# Patient Record
Sex: Female | Born: 1960
Health system: Southern US, Community
[De-identification: ages and names within clinical notes are randomized; demographics above are authoritative.]

## PROBLEM LIST (undated history)

## (undated) DIAGNOSIS — R748 Abnormal levels of other serum enzymes: Secondary | ICD-10-CM

## (undated) DIAGNOSIS — R7303 Prediabetes: Secondary | ICD-10-CM

## (undated) DIAGNOSIS — J309 Allergic rhinitis, unspecified: Secondary | ICD-10-CM

## (undated) DIAGNOSIS — M7550 Bursitis of unspecified shoulder: Secondary | ICD-10-CM

## (undated) DIAGNOSIS — F3342 Major depressive disorder, recurrent, in full remission: Secondary | ICD-10-CM

## (undated) DIAGNOSIS — K219 Gastro-esophageal reflux disease without esophagitis: Secondary | ICD-10-CM

## (undated) DIAGNOSIS — M542 Cervicalgia: Secondary | ICD-10-CM

## (undated) DIAGNOSIS — D649 Anemia, unspecified: Secondary | ICD-10-CM

## (undated) DIAGNOSIS — M25562 Pain in left knee: Secondary | ICD-10-CM

## (undated) DIAGNOSIS — L710 Perioral dermatitis: Secondary | ICD-10-CM

## (undated) DIAGNOSIS — F32A Depression, unspecified: Secondary | ICD-10-CM

## (undated) DIAGNOSIS — F329 Major depressive disorder, single episode, unspecified: Secondary | ICD-10-CM

## (undated) DIAGNOSIS — E669 Obesity, unspecified: Secondary | ICD-10-CM

## (undated) DIAGNOSIS — K589 Irritable bowel syndrome without diarrhea: Secondary | ICD-10-CM

## (undated) HISTORY — DX: Prediabetes: R73.03

## (undated) HISTORY — DX: Allergic rhinitis, unspecified: J30.9

## (undated) HISTORY — DX: Major depressive disorder, recurrent, in full remission: F33.42

## (undated) HISTORY — DX: Depression, unspecified: F32.A

## (undated) HISTORY — PX: SHOULDER SURGERY: SHX246

## (undated) HISTORY — DX: Cervicalgia: M54.2

## (undated) HISTORY — DX: Anemia, unspecified: D64.9

## (undated) HISTORY — DX: Abnormal levels of other serum enzymes: R74.8

## (undated) HISTORY — DX: Irritable bowel syndrome, unspecified: K58.9

## (undated) HISTORY — DX: Perioral dermatitis: L71.0

## (undated) HISTORY — DX: Major depressive disorder, single episode, unspecified: F32.9

## (undated) HISTORY — DX: Obesity, unspecified: E66.9

## (undated) HISTORY — DX: Bursitis of unspecified shoulder: M75.50

## (undated) HISTORY — DX: Gastro-esophageal reflux disease without esophagitis: K21.9

## (undated) HISTORY — DX: Pain in left knee: M25.562

## (undated) HISTORY — PX: OTHER SURGICAL HISTORY: SHX169

---

## 1997-04-13 ENCOUNTER — Inpatient Hospital Stay (HOSPITAL_COMMUNITY): Admission: AD | Admit: 1997-04-13 | Discharge: 1997-04-13 | Payer: Self-pay | Admitting: Obstetrics and Gynecology

## 1997-04-14 ENCOUNTER — Inpatient Hospital Stay (HOSPITAL_COMMUNITY): Admission: AD | Admit: 1997-04-14 | Discharge: 1997-04-14 | Payer: Self-pay | Admitting: Obstetrics and Gynecology

## 1997-05-19 ENCOUNTER — Inpatient Hospital Stay (HOSPITAL_COMMUNITY): Admission: AD | Admit: 1997-05-19 | Discharge: 1997-05-19 | Payer: Self-pay | Admitting: *Deleted

## 1997-05-26 ENCOUNTER — Observation Stay (HOSPITAL_COMMUNITY): Admission: AD | Admit: 1997-05-26 | Discharge: 1997-05-27 | Payer: Self-pay | Admitting: *Deleted

## 1997-06-07 ENCOUNTER — Inpatient Hospital Stay (HOSPITAL_COMMUNITY): Admission: AD | Admit: 1997-06-07 | Discharge: 1997-06-09 | Payer: Self-pay | Admitting: Obstetrics and Gynecology

## 1998-07-29 ENCOUNTER — Other Ambulatory Visit: Admission: RE | Admit: 1998-07-29 | Discharge: 1998-07-29 | Payer: Self-pay | Admitting: Obstetrics and Gynecology

## 1999-08-15 ENCOUNTER — Other Ambulatory Visit: Admission: RE | Admit: 1999-08-15 | Discharge: 1999-08-15 | Payer: Self-pay | Admitting: Obstetrics and Gynecology

## 2000-07-30 ENCOUNTER — Encounter: Payer: Self-pay | Admitting: Obstetrics and Gynecology

## 2000-07-30 ENCOUNTER — Inpatient Hospital Stay (HOSPITAL_COMMUNITY): Admission: AD | Admit: 2000-07-30 | Discharge: 2000-07-30 | Payer: Self-pay | Admitting: Obstetrics and Gynecology

## 2000-08-19 ENCOUNTER — Inpatient Hospital Stay (HOSPITAL_COMMUNITY): Admission: AD | Admit: 2000-08-19 | Discharge: 2000-08-19 | Payer: Self-pay | Admitting: Obstetrics and Gynecology

## 2000-10-11 ENCOUNTER — Inpatient Hospital Stay (HOSPITAL_COMMUNITY): Admission: AD | Admit: 2000-10-11 | Discharge: 2000-10-11 | Payer: Self-pay | Admitting: Obstetrics and Gynecology

## 2000-10-16 ENCOUNTER — Inpatient Hospital Stay (HOSPITAL_COMMUNITY): Admission: AD | Admit: 2000-10-16 | Discharge: 2000-10-18 | Payer: Self-pay | Admitting: Obstetrics & Gynecology

## 2000-11-15 ENCOUNTER — Other Ambulatory Visit: Admission: RE | Admit: 2000-11-15 | Discharge: 2000-11-15 | Payer: Self-pay | Admitting: Obstetrics and Gynecology

## 2001-12-18 ENCOUNTER — Other Ambulatory Visit: Admission: RE | Admit: 2001-12-18 | Discharge: 2001-12-18 | Payer: Self-pay | Admitting: Obstetrics and Gynecology

## 2002-05-02 ENCOUNTER — Encounter: Admission: RE | Admit: 2002-05-02 | Discharge: 2002-05-02 | Payer: Self-pay | Admitting: Obstetrics and Gynecology

## 2002-05-02 ENCOUNTER — Encounter: Payer: Self-pay | Admitting: Obstetrics and Gynecology

## 2003-01-07 ENCOUNTER — Other Ambulatory Visit: Admission: RE | Admit: 2003-01-07 | Discharge: 2003-01-07 | Payer: Self-pay | Admitting: Obstetrics and Gynecology

## 2004-02-10 ENCOUNTER — Other Ambulatory Visit: Admission: RE | Admit: 2004-02-10 | Discharge: 2004-02-10 | Payer: Self-pay | Admitting: Obstetrics and Gynecology

## 2006-08-05 ENCOUNTER — Emergency Department (HOSPITAL_COMMUNITY): Admission: EM | Admit: 2006-08-05 | Discharge: 2006-08-05 | Payer: Self-pay | Admitting: Emergency Medicine

## 2009-12-18 ENCOUNTER — Encounter: Admission: RE | Admit: 2009-12-18 | Discharge: 2009-12-18 | Payer: Self-pay | Admitting: Family Medicine

## 2010-02-20 ENCOUNTER — Encounter: Payer: Self-pay | Admitting: Family Medicine

## 2010-11-15 LAB — URINALYSIS, ROUTINE W REFLEX MICROSCOPIC
Bilirubin Urine: NEGATIVE
Glucose, UA: NEGATIVE
Hgb urine dipstick: NEGATIVE
Ketones, ur: NEGATIVE
Nitrite: NEGATIVE
Protein, ur: NEGATIVE
Specific Gravity, Urine: 1.027
Urobilinogen, UA: 0.2
pH: 6

## 2010-11-15 LAB — URINE MICROSCOPIC-ADD ON

## 2012-10-15 ENCOUNTER — Ambulatory Visit (INDEPENDENT_AMBULATORY_CARE_PROVIDER_SITE_OTHER): Payer: BC Managed Care – PPO | Admitting: Physician Assistant

## 2012-10-15 VITALS — BP 130/78 | HR 70 | Temp 98.6°F | Resp 18 | Ht 66.5 in | Wt 211.0 lb

## 2012-10-15 DIAGNOSIS — T7840XA Allergy, unspecified, initial encounter: Secondary | ICD-10-CM

## 2012-10-15 DIAGNOSIS — L282 Other prurigo: Secondary | ICD-10-CM

## 2012-10-15 DIAGNOSIS — Z131 Encounter for screening for diabetes mellitus: Secondary | ICD-10-CM

## 2012-10-15 LAB — GLUCOSE, POCT (MANUAL RESULT ENTRY): POC Glucose: 81 mg/dl (ref 70–99)

## 2012-10-15 MED ORDER — METHYLPREDNISOLONE SODIUM SUCC 125 MG IJ SOLR
125.0000 mg | Freq: Once | INTRAMUSCULAR | Status: AC
  Administered 2012-10-15: 125 mg via INTRAMUSCULAR

## 2012-10-15 MED ORDER — METHYLPREDNISOLONE (PAK) 4 MG PO TABS: ORAL_TABLET | ORAL | Status: DC

## 2012-10-15 NOTE — Patient Instructions (Signed)
Continue Benadryl 25-50 mg at bedtime Zyrtec, Claritin, or Allegra (choose 1) daily in the morning Prednisone taper - start tomorrow morning Return if symptoms seem to be worsening or fail to improve.

## 2012-10-15 NOTE — Progress Notes (Signed)
  Subjective:    Patient ID: Donna Herman, female    DOB: [birth year], 52 y.o.   MRN: [id number]  HPI 52 year old female presents with acute onset of rash on face and neck. States symptoms started on 9/14 and has progressively worsened.  Initially only face involved, but it has now spread to her neck. Admits it has become increasingly pruritic and seems to be spreading. [place] resort [place] the [place] and did use the shampoos and soaps that were provided there.  Also ate shrimp the night before the rash started. No hx of allergies to soaps or shellfish. No known allergies or hx of prior reactions like this.  Denies SOB, lip/tongue swelling, or trouble breathing. No vision changes or headache.  Patient is otherwise doing well with no other concerns today.      Review of Systems  Constitutional: Negative for fever and chills.  HENT: Negative for trouble swallowing.   Respiratory: Negative for chest tightness and shortness of breath.   Skin: Positive for rash.       Objective:   Physical Exam  Constitutional: She is oriented to person, place, and time. She appears well-developed and well-nourished.  HENT:  Head: Normocephalic and atraumatic.  Right Ear: External ear normal.  Left Ear: External ear normal.  Eyes: Conjunctivae are normal.  Neck: Normal range of motion.  Cardiovascular: Normal rate, regular rhythm and normal heart sounds.   Pulmonary/Chest: Effort normal and breath sounds normal.  Neurological: She is alert and oriented to person, place, and time.  Skin:     Noted areas have erythematous wheals. No vesicles, induration, drainage, or tenderness.   Psychiatric: She has a normal mood and affect. Her behavior is normal. Judgment and thought content normal.          Assessment & Plan:  Allergic reaction, initial encounter - Plan: methylPREDNISolone sodium succinate (SOLU-MEDROL) 125 mg/2 mL injection 125 mg, methylPREDNIsolone (MEDROL DOSPACK) 4 MG tablet  Pruritic  rash  Screening for diabetes mellitus - Plan: POCT glucose (manual entry)  Solumedrol 125 mg IM today Start medrol dose pack tomorrow Continue Benadryl 25-50 mg at bedtime Zyrtec, Claritin, or Allegra daily in the morning Return if symptoms worsen or fail to improve.

## 2012-12-31 ENCOUNTER — Encounter: Payer: Self-pay | Admitting: Podiatry

## 2012-12-31 ENCOUNTER — Ambulatory Visit (INDEPENDENT_AMBULATORY_CARE_PROVIDER_SITE_OTHER): Payer: BC Managed Care – PPO | Admitting: Podiatry

## 2012-12-31 VITALS — BP 136/75 | HR 71 | Resp 16 | Ht 66.0 in | Wt 211.0 lb

## 2012-12-31 DIAGNOSIS — M778 Other enthesopathies, not elsewhere classified: Secondary | ICD-10-CM

## 2012-12-31 DIAGNOSIS — M7750 Other enthesopathy of unspecified foot: Secondary | ICD-10-CM

## 2012-12-31 NOTE — Progress Notes (Signed)
Donna Herman presents today for followup of her second metatarsophalangeal joint. Last time I saw her was June of 2014 for we once again performed a periarticular or intra-articular steroid injection to the second metatarsophalangeal joint. We have discussed shoe gear as well as orthotics at that time. I have suggested that she followup with [place] in the next few months for possible surgical intervention should her pain not resolve.  Objective: Vital signs are stable she is alert and oriented x3. Pulses remain palpable bilateral lower extremity. She presents today with a hammertoe deformity second left and severe pain overlying the second metatarsophalangeal joint. The pain is very sharp and painful on range of motion of the second metatarsophalangeal joint left. There is overlying erythema and edema is present. Radiographic evaluation demonstrates no fractures but a long second metatarsal.  Assessment: Capsulitis second metatarsophalangeal joint left chronic in nature possible do to plantar flexed elongated second metatarsal and hammertoe deformity.  Plan: MRI to rule out a rupture of the plantar plate and evaluation of the bone prior to surgical consideration.

## 2013-01-06 ENCOUNTER — Telehealth: Payer: Self-pay | Admitting: *Deleted

## 2013-01-06 NOTE — Telephone Encounter (Signed)
Pt states she has not been called for her MRI appt.  I called [employer] on [address], they informed me they do not work from the [employer] and pt's are to be instructed to call [phone].  I made an appt for pt 01/15/2013 at 1215pm arrival for a 1230pm appt.

## 2013-01-08 ENCOUNTER — Other Ambulatory Visit: Payer: Self-pay

## 2013-01-27 ENCOUNTER — Telehealth: Payer: Self-pay | Admitting: *Deleted

## 2013-01-27 NOTE — Telephone Encounter (Signed)
Prior authorization from [facility] [id number], for MRI scheduled 02/03/2013 at 0900am of left foot.  This is valid 12/292014 to 02/25/2013.

## 2013-02-03 ENCOUNTER — Ambulatory Visit
Admission: RE | Admit: 2013-02-03 | Discharge: 2013-02-03 | Disposition: A | Discharge status: Home / Self Care | Payer: BC Managed Care – PPO | Source: Ambulatory Visit | Attending: Podiatry | Admitting: Podiatry

## 2013-02-03 DIAGNOSIS — M779 Enthesopathy, unspecified: Principal | ICD-10-CM

## 2013-02-03 DIAGNOSIS — M778 Other enthesopathies, not elsewhere classified: Secondary | ICD-10-CM

## 2013-02-04 NOTE — Progress Notes (Signed)
Scheduled pt an appt to come in next Tuesday 02-11-13 at 9am.

## 2013-02-11 ENCOUNTER — Ambulatory Visit: Payer: BC Managed Care – PPO | Admitting: Podiatry

## 2013-02-13 ENCOUNTER — Ambulatory Visit (INDEPENDENT_AMBULATORY_CARE_PROVIDER_SITE_OTHER): Payer: BC Managed Care – PPO | Admitting: Podiatry

## 2013-02-13 ENCOUNTER — Encounter: Payer: Self-pay | Admitting: Podiatry

## 2013-02-13 VITALS — BP 118/73 | HR 70 | Resp 16

## 2013-02-13 DIAGNOSIS — M779 Enthesopathy, unspecified: Principal | ICD-10-CM

## 2013-02-13 DIAGNOSIS — M7750 Other enthesopathy of unspecified foot: Secondary | ICD-10-CM

## 2013-02-13 DIAGNOSIS — M778 Other enthesopathies, not elsewhere classified: Secondary | ICD-10-CM

## 2013-02-13 NOTE — Progress Notes (Signed)
Mri results, left foot still about the same, the shoe helps a little bit. She states that her friend have suggested that she see a chiropractor for cold laser therapy.  Objective: Vital signs are stable she is alert and oriented x3. Pulses are strongly palpable right. MRI was positive for a soft tissue mass between the second third digits of the left foot. More than likely capsular rupture or a tear of the plantar plate allowing for medial deviation of the second toe. We also discussed the fact that she has a mild bunion deformity there is also resulting in chronic pressure about the second metatarsophalangeal joint. MRI was not specific for a ruptured joint.  Assessment: Chronic capsulitis and probable tear of the lateral collateral ligament of the second metatarsophalangeal joint left foot mild bunion deformity left foot.  Plan: We discussed the etiology pathology conservative versus surgical therapies. I encouraged her to try the cold laser therapy to see if this would alleviate her symptoms however I did expressed to her that this would do nothing to change the anatomy the resulted in the problem in the first place. I also explained to her that over time this will more than likely worsen resulting in a cocked up medial dislocated second toe possibly overlapping the hallux. She will seek a second opinion possibly be treated for with cold laser therapy and all followup with her in the near future for surgery. We considered her today for a second metatarsal osteotomy with screw. Answered all the questions regarding this to the best of my ability in layman's terms she understood it was amenable to it and signed Dr. pages of the consent form.

## 2013-10-17 ENCOUNTER — Ambulatory Visit
Admission: RE | Admit: 2013-10-17 | Discharge: 2013-10-17 | Disposition: A | Discharge status: Home / Self Care | Payer: BC Managed Care – PPO | Source: Ambulatory Visit | Attending: Gastroenterology | Admitting: Gastroenterology

## 2013-10-17 ENCOUNTER — Other Ambulatory Visit: Payer: Self-pay | Admitting: Gastroenterology

## 2013-10-17 DIAGNOSIS — K589 Irritable bowel syndrome without diarrhea: Secondary | ICD-10-CM

## 2013-10-17 DIAGNOSIS — K625 Hemorrhage of anus and rectum: Secondary | ICD-10-CM

## 2013-10-17 DIAGNOSIS — T189XXA Foreign body of alimentary tract, part unspecified, initial encounter: Secondary | ICD-10-CM

## 2014-01-13 ENCOUNTER — Ambulatory Visit: Payer: BC Managed Care – PPO | Admitting: Podiatry

## 2014-01-20 ENCOUNTER — Ambulatory Visit (INDEPENDENT_AMBULATORY_CARE_PROVIDER_SITE_OTHER): Payer: BC Managed Care – PPO

## 2014-01-20 ENCOUNTER — Encounter: Payer: Self-pay | Admitting: Podiatry

## 2014-01-20 ENCOUNTER — Ambulatory Visit (INDEPENDENT_AMBULATORY_CARE_PROVIDER_SITE_OTHER): Payer: BC Managed Care – PPO | Admitting: Podiatry

## 2014-01-20 DIAGNOSIS — M779 Enthesopathy, unspecified: Secondary | ICD-10-CM

## 2014-01-20 DIAGNOSIS — R52 Pain, unspecified: Secondary | ICD-10-CM

## 2014-01-20 DIAGNOSIS — Q828 Other specified congenital malformations of skin: Secondary | ICD-10-CM

## 2014-01-20 DIAGNOSIS — M7752 Other enthesopathy of left foot: Secondary | ICD-10-CM

## 2014-01-20 DIAGNOSIS — M778 Other enthesopathies, not elsewhere classified: Secondary | ICD-10-CM

## 2014-01-20 NOTE — Progress Notes (Signed)
   Subjective:    Patient ID: Donna MarusWanda S Herman, female    DOB: [birth year], 53 y.o.   MRN: [id number]  HPI NEW PROBLEM:  PT STATED RT OUTSIDE OF THE FOOT IS PAINFUL FOR 3 MONTHS. THE FOOT IS GETTING WORSE AND GET AGGRAVATED WHEN WEARING SHOES. TRIED NO TREATMENT.  Review of Systems  All other systems reviewed and are negative.      Objective:   Physical Exam: Pulses remain palpable to the right foot. Neurologic sensorium is intact. Evaluation demonstrates a tailor's bunion deformity with an area of chronic irritation overlying the fifth metatarsal head secondary to this tailor's bunion deformity. This has resulted in a bursitis and an overlying reactive hyperkeratotic lesion. Radiographic evaluation confirms this.        Assessment & Plan:  Assessment: Bursitis capsulitis porokeratosis tailor's bunion deformity right foot.  Plan: Injected sublesionally today with dexamethasone and local anesthetic to decrease the bursitis and capsulitis. Mechanical debridement of porokeratotic lesion. Discussed appropriate shoe gear stretching exercises ice therapy and shoe modifications. Follow-up with her as needed

## 2014-10-10 ENCOUNTER — Other Ambulatory Visit: Admission: RE | Admit: 2014-10-10 | Payer: Self-pay | Source: Ambulatory Visit | Admitting: *Deleted

## 2015-05-04 DIAGNOSIS — H5213 Myopia, bilateral: Secondary | ICD-10-CM | POA: Diagnosis not present

## 2015-08-25 ENCOUNTER — Other Ambulatory Visit: Payer: Self-pay | Admitting: Family Medicine

## 2015-08-25 ENCOUNTER — Ambulatory Visit
Admission: RE | Admit: 2015-08-25 | Discharge: 2015-08-25 | Disposition: A | Discharge status: Home / Self Care | Payer: BLUE CROSS/BLUE SHIELD | Source: Ambulatory Visit | Attending: Family Medicine | Admitting: Family Medicine

## 2015-08-25 DIAGNOSIS — R1013 Epigastric pain: Secondary | ICD-10-CM | POA: Diagnosis not present

## 2015-08-25 DIAGNOSIS — K37 Unspecified appendicitis: Secondary | ICD-10-CM

## 2015-08-25 DIAGNOSIS — R1031 Right lower quadrant pain: Secondary | ICD-10-CM | POA: Diagnosis not present

## 2015-08-25 MED ORDER — IOPAMIDOL (ISOVUE-300) INJECTION 61%: 100.0000 mL | Freq: Once | INTRAVENOUS | Status: DC | PRN

## 2015-12-30 DIAGNOSIS — Z6837 Body mass index (BMI) 37.0-37.9, adult: Secondary | ICD-10-CM | POA: Diagnosis not present

## 2015-12-30 DIAGNOSIS — Z01419 Encounter for gynecological examination (general) (routine) without abnormal findings: Secondary | ICD-10-CM | POA: Diagnosis not present

## 2016-02-22 DIAGNOSIS — R079 Chest pain, unspecified: Secondary | ICD-10-CM | POA: Diagnosis not present

## 2016-03-06 DIAGNOSIS — Z1231 Encounter for screening mammogram for malignant neoplasm of breast: Secondary | ICD-10-CM | POA: Diagnosis not present

## 2016-03-10 DIAGNOSIS — Z862 Personal history of diseases of the blood and blood-forming organs and certain disorders involving the immune mechanism: Secondary | ICD-10-CM | POA: Diagnosis not present

## 2016-03-10 DIAGNOSIS — Z0000 Encounter for general adult medical examination without abnormal findings: Secondary | ICD-10-CM | POA: Diagnosis not present

## 2016-06-28 DIAGNOSIS — H5213 Myopia, bilateral: Secondary | ICD-10-CM | POA: Diagnosis not present

## 2016-08-30 DIAGNOSIS — Z23 Encounter for immunization: Secondary | ICD-10-CM | POA: Diagnosis not present

## 2016-08-30 DIAGNOSIS — Z111 Encounter for screening for respiratory tuberculosis: Secondary | ICD-10-CM | POA: Diagnosis not present

## 2016-10-31 DIAGNOSIS — Z23 Encounter for immunization: Secondary | ICD-10-CM | POA: Diagnosis not present

## 2016-12-08 DIAGNOSIS — K602 Anal fissure, unspecified: Secondary | ICD-10-CM | POA: Diagnosis not present

## 2016-12-08 DIAGNOSIS — K625 Hemorrhage of anus and rectum: Secondary | ICD-10-CM | POA: Diagnosis not present

## 2017-01-16 DIAGNOSIS — K625 Hemorrhage of anus and rectum: Secondary | ICD-10-CM | POA: Diagnosis not present

## 2017-03-07 DIAGNOSIS — Z1231 Encounter for screening mammogram for malignant neoplasm of breast: Secondary | ICD-10-CM | POA: Diagnosis not present

## 2017-03-12 DIAGNOSIS — Z23 Encounter for immunization: Secondary | ICD-10-CM | POA: Diagnosis not present

## 2017-03-12 DIAGNOSIS — K625 Hemorrhage of anus and rectum: Secondary | ICD-10-CM | POA: Diagnosis not present

## 2017-03-12 DIAGNOSIS — Z13220 Encounter for screening for lipoid disorders: Secondary | ICD-10-CM | POA: Diagnosis not present

## 2017-03-12 DIAGNOSIS — Z0000 Encounter for general adult medical examination without abnormal findings: Secondary | ICD-10-CM | POA: Diagnosis not present

## 2017-03-12 DIAGNOSIS — Z1159 Encounter for screening for other viral diseases: Secondary | ICD-10-CM | POA: Diagnosis not present

## 2017-03-12 DIAGNOSIS — E6609 Other obesity due to excess calories: Secondary | ICD-10-CM | POA: Diagnosis not present

## 2017-03-12 DIAGNOSIS — Z131 Encounter for screening for diabetes mellitus: Secondary | ICD-10-CM | POA: Diagnosis not present

## 2017-04-05 DIAGNOSIS — Z01419 Encounter for gynecological examination (general) (routine) without abnormal findings: Secondary | ICD-10-CM | POA: Diagnosis not present

## 2017-05-03 DIAGNOSIS — Z6834 Body mass index (BMI) 34.0-34.9, adult: Secondary | ICD-10-CM | POA: Diagnosis not present

## 2017-05-03 DIAGNOSIS — Z713 Dietary counseling and surveillance: Secondary | ICD-10-CM | POA: Diagnosis not present

## 2017-05-03 DIAGNOSIS — N951 Menopausal and female climacteric states: Secondary | ICD-10-CM | POA: Diagnosis not present

## 2017-11-05 DIAGNOSIS — M25562 Pain in left knee: Secondary | ICD-10-CM | POA: Diagnosis not present

## 2017-11-05 DIAGNOSIS — M79672 Pain in left foot: Secondary | ICD-10-CM | POA: Diagnosis not present

## 2017-11-05 DIAGNOSIS — M25561 Pain in right knee: Secondary | ICD-10-CM | POA: Diagnosis not present

## 2017-11-15 DIAGNOSIS — M25562 Pain in left knee: Secondary | ICD-10-CM | POA: Diagnosis not present

## 2017-11-15 DIAGNOSIS — M25561 Pain in right knee: Secondary | ICD-10-CM | POA: Diagnosis not present

## 2017-11-15 DIAGNOSIS — R079 Chest pain, unspecified: Secondary | ICD-10-CM | POA: Diagnosis not present

## 2017-11-20 DIAGNOSIS — M25562 Pain in left knee: Secondary | ICD-10-CM | POA: Diagnosis not present

## 2017-11-20 DIAGNOSIS — M25561 Pain in right knee: Secondary | ICD-10-CM | POA: Diagnosis not present

## 2018-03-11 DIAGNOSIS — Z1231 Encounter for screening mammogram for malignant neoplasm of breast: Secondary | ICD-10-CM | POA: Diagnosis not present

## 2018-03-26 DIAGNOSIS — E669 Obesity, unspecified: Secondary | ICD-10-CM | POA: Diagnosis not present

## 2018-03-26 DIAGNOSIS — Z13220 Encounter for screening for lipoid disorders: Secondary | ICD-10-CM | POA: Diagnosis not present

## 2018-03-26 DIAGNOSIS — Z0000 Encounter for general adult medical examination without abnormal findings: Secondary | ICD-10-CM | POA: Diagnosis not present

## 2018-03-26 DIAGNOSIS — K219 Gastro-esophageal reflux disease without esophagitis: Secondary | ICD-10-CM | POA: Diagnosis not present

## 2018-03-26 DIAGNOSIS — R635 Abnormal weight gain: Secondary | ICD-10-CM | POA: Diagnosis not present

## 2018-03-26 DIAGNOSIS — F3342 Major depressive disorder, recurrent, in full remission: Secondary | ICD-10-CM | POA: Diagnosis not present

## 2018-06-19 ENCOUNTER — Other Ambulatory Visit: Payer: Self-pay

## 2018-06-19 ENCOUNTER — Encounter: Payer: Self-pay | Admitting: Emergency Medicine

## 2018-06-19 ENCOUNTER — Ambulatory Visit
Admission: EM | Admit: 2018-06-19 | Discharge: 2018-06-19 | Disposition: A | Discharge status: Home / Self Care | Payer: BLUE CROSS/BLUE SHIELD | Attending: Physician Assistant | Admitting: Physician Assistant

## 2018-06-19 DIAGNOSIS — J029 Acute pharyngitis, unspecified: Secondary | ICD-10-CM

## 2018-06-19 DIAGNOSIS — K120 Recurrent oral aphthae: Secondary | ICD-10-CM

## 2018-06-19 MED ORDER — CHLORHEXIDINE GLUCONATE 0.12 % MT SOLN: 15.0000 mL | Freq: Two times a day (BID) | OROMUCOSAL | 0 refills | Status: DC

## 2018-06-19 MED ORDER — FLUTICASONE PROPIONATE 50 MCG/ACT NA SUSP: 2.0000 | Freq: Every day | NASAL | 0 refills | Status: AC

## 2018-06-19 NOTE — ED Notes (Signed)
Patient able to ambulate independently  

## 2018-06-19 NOTE — Discharge Instructions (Signed)
Start peridex for hygiene. Flonase for nasal congestion/drainage. Continue allergy medicine. Keep hydrated, urine should be clear to pale yellow in color. You can use oragel if needing pain relief. Monitor for any worsening of symptoms, trouble breathing, trouble swallowing, swelling of the throat, leaning forward to breath, drooling, follow up here or at the emergency department for reevaluation.

## 2018-06-19 NOTE — ED Triage Notes (Signed)
Pt presents to [facility] for assessment of cough, congestion, sneezing since being in the yard on Friday.  Pt states this weekend she began having a sore throat, and patient states she noted white spots in the back of her throat.  Pt has been doing warm salt water gargles.  Pt states she noted this afternoon that the spots are spreading in her throat.

## 2018-06-19 NOTE — ED Provider Notes (Signed)
[facility]    CSN: [id number] Arrival date & time: 06/19/18  1518     History   Chief Complaint Chief Complaint  Patient presents with  . Cough    HPI Donna Herman is a 58 y.o. female.   58 year old female comes in for 6-day history of sore throat.  States she has been experiencing 1 to 2 days of cough and sneezing after working in the yard.  She worked in the yard about 6 days ago, and had a few episodes of cough and sneezing, that resolved after a day or 2.  She started noticing sore throat, and occasional "lump in the throat" sensation.  Denies trouble breathing, trouble swallowing, tripoding, drooling, trismus.  Patient states she is to have rhinorrhea, nasal congestion from allergies, but has since resolved after taking loratadine for the past 2 to 3 weeks.  Denies fever, chills, night sweats.  Patient states noticed a spot in the back of her throat, and she has been doing salt water gargles with some relief.  She noticed a second spot in her throat today, and came in for evaluation.  She denies any sick contact.     Past Medical History:  Diagnosis Date  . Depression     There are no active problems to display for this patient.   History reviewed. No pertinent surgical history.  OB History   No obstetric history on file.      Home Medications    Prior to Admission medications   Medication Sig Start Date End Date Taking? Authorizing Provider  buPROPion (WELLBUTRIN XL) 150 MG 24 hr tablet Take 150 mg by mouth daily.    [provider]  chlorhexidine (PERIDEX) 0.12 % solution Use as directed 15 mLs in the mouth or throat 2 (two) times daily. 06/19/18   Cathie HoopsYu, Amy V, PA-C  fish oil-omega-3 fatty acids 1000 MG capsule Take 2 g by mouth daily.    [provider]  fluticasone (FLONASE) 50 MCG/ACT nasal spray Place 2 sprays into both nostrils daily. 06/19/18   Cathie HoopsYu, Amy V, PA-C  Glucosamine-MSM-Hyaluronic Acd (JOINT HEALTH PO) Take by mouth.     [provider]  Multiple Vitamin (MULTIVITAMIN) tablet Take 1 tablet by mouth daily.    [provider]  Norgestimate-Eth Estradiol (SPRINTEC 28 PO) Take by mouth.    [provider]  omeprazole (PRILOSEC) 20 MG capsule Take 20 mg by mouth daily.    [provider]  vitamin E 100 UNIT capsule Take 100 Units by mouth daily.    [provider]    Family History History reviewed. No pertinent family history.  Social History Social History   Tobacco Use  . Smoking status: Never Smoker  . Smokeless tobacco: Never Used  Substance Use Topics  . Alcohol use: No  . Drug use: No     Allergies   Patient has no known allergies.   Review of Systems Review of Systems  Reason unable to perform ROS: See HPI as above.     Physical Exam Triage Vital Signs ED Triage Vitals  Enc Vitals Group     BP 06/19/18 1526 (!) 146/92     Pulse Rate 06/19/18 1526 87     Resp 06/19/18 1526 18     Temp 06/19/18 1526 99.2 F (37.3 C)     Temp Source 06/19/18 1526 Oral     SpO2 06/19/18 1526 97 %     Weight --  Height --      Head Circumference --      Peak Flow --      Pain Score 06/19/18 1529 3     Pain Loc --      Pain Edu? --      Excl. in GC? --    No data found.  Updated Vital Signs BP (!) 146/92 (BP Location: Left Arm)   Pulse 87   Temp 99.2 F (37.3 C) (Oral)   Resp 18   LMP 10/05/2012   SpO2 97%   Physical Exam Constitutional:      General: She is not in acute distress.    Appearance: Normal appearance. She is not ill-appearing, toxic-appearing or diaphoretic.  HENT:     Head: Normocephalic and atraumatic.     Mouth/Throat:     Mouth: Mucous membranes are moist.     Pharynx: Oropharynx is clear. Uvula midline.      Comments: One isolated canker sore noted. No other sores/ulcerations noted. No tonsillar exudates.  Neck:     Musculoskeletal: Normal range of motion and neck supple.  Cardiovascular:     Rate and Rhythm:  Normal rate and regular rhythm.     Heart sounds: Normal heart sounds. No murmur. No friction rub. No gallop.   Pulmonary:     Effort: Pulmonary effort is normal. No accessory muscle usage, prolonged expiration, respiratory distress or retractions.     Comments: Lungs clear to auscultation without adventitious lung sounds. Neurological:     General: No focal deficit present.     Mental Status: She is alert and oriented to person, place, and time.    UC Treatments / Results  Labs (all labs ordered are listed, but only abnormal results are displayed) Labs Reviewed - No data to display  EKG None  Radiology No results found.  Procedures Procedures (including critical care time)  Medications Ordered in UC Medications - No data to display  Initial Impression / Assessment and Plan / UC Course  I have reviewed the triage vital signs and the nursing notes.  Pertinent labs & imaging results that were available during my care of the patient were reviewed by me and considered in my medical decision making (see chart for details).    Discussed using flonase for better allergy control. No signs of strep. Discussed canker sore causing symptoms. Low suspicion of HSV given no history of cold sores, and no groupings of ulcerations. Peridex for hygiene. Return precautions given. Patient expresses understanding and agrees to plan.  Final Clinical Impressions(s) / UC Diagnoses   Final diagnoses:  Sore throat  Canker sore   ED Prescriptions    Medication Sig Dispense Auth. Provider   chlorhexidine (PERIDEX) 0.12 % solution Use as directed 15 mLs in the mouth or throat 2 (two) times daily. 240 mL Yu, Amy V, PA-C   fluticasone (FLONASE) 50 MCG/ACT nasal spray Place 2 sprays into both nostrils daily. 1 g Threasa Alpha, [place] 06/19/18 1559

## 2018-06-28 DIAGNOSIS — H6983 Other specified disorders of Eustachian tube, bilateral: Secondary | ICD-10-CM | POA: Diagnosis not present

## 2018-09-06 DIAGNOSIS — Y9389 Activity, other specified: Secondary | ICD-10-CM | POA: Diagnosis not present

## 2018-09-06 DIAGNOSIS — M25531 Pain in right wrist: Secondary | ICD-10-CM | POA: Diagnosis not present

## 2018-09-06 DIAGNOSIS — S4990XA Unspecified injury of shoulder and upper arm, unspecified arm, initial encounter: Secondary | ICD-10-CM | POA: Diagnosis not present

## 2018-09-06 DIAGNOSIS — S6991XA Unspecified injury of right wrist, hand and finger(s), initial encounter: Secondary | ICD-10-CM | POA: Diagnosis not present

## 2018-09-06 DIAGNOSIS — W1839XA Other fall on same level, initial encounter: Secondary | ICD-10-CM | POA: Diagnosis not present

## 2018-09-06 DIAGNOSIS — S4991XA Unspecified injury of right shoulder and upper arm, initial encounter: Secondary | ICD-10-CM | POA: Diagnosis not present

## 2018-09-06 DIAGNOSIS — M25511 Pain in right shoulder: Secondary | ICD-10-CM | POA: Diagnosis not present

## 2018-09-06 DIAGNOSIS — K219 Gastro-esophageal reflux disease without esophagitis: Secondary | ICD-10-CM | POA: Diagnosis not present

## 2018-09-06 DIAGNOSIS — Z79899 Other long term (current) drug therapy: Secondary | ICD-10-CM | POA: Diagnosis not present

## 2018-09-11 DIAGNOSIS — S63501A Unspecified sprain of right wrist, initial encounter: Secondary | ICD-10-CM | POA: Diagnosis not present

## 2018-09-11 DIAGNOSIS — M25511 Pain in right shoulder: Secondary | ICD-10-CM | POA: Diagnosis not present

## 2018-09-11 DIAGNOSIS — M25531 Pain in right wrist: Secondary | ICD-10-CM | POA: Diagnosis not present

## 2018-09-24 ENCOUNTER — Other Ambulatory Visit: Payer: Self-pay | Admitting: Family Medicine

## 2018-09-24 DIAGNOSIS — G8929 Other chronic pain: Secondary | ICD-10-CM

## 2018-09-25 DIAGNOSIS — Z6837 Body mass index (BMI) 37.0-37.9, adult: Secondary | ICD-10-CM | POA: Diagnosis not present

## 2018-09-25 DIAGNOSIS — Z01419 Encounter for gynecological examination (general) (routine) without abnormal findings: Secondary | ICD-10-CM | POA: Diagnosis not present

## 2018-10-13 ENCOUNTER — Other Ambulatory Visit: Payer: Self-pay

## 2018-10-13 ENCOUNTER — Ambulatory Visit
Admission: RE | Admit: 2018-10-13 | Discharge: 2018-10-13 | Disposition: A | Discharge status: Home / Self Care | Payer: Self-pay | Source: Ambulatory Visit | Attending: Family Medicine | Admitting: Family Medicine

## 2018-10-13 DIAGNOSIS — S43431A Superior glenoid labrum lesion of right shoulder, initial encounter: Secondary | ICD-10-CM | POA: Diagnosis not present

## 2018-10-13 DIAGNOSIS — G8929 Other chronic pain: Secondary | ICD-10-CM

## 2018-10-13 DIAGNOSIS — M25511 Pain in right shoulder: Secondary | ICD-10-CM

## 2018-10-28 DIAGNOSIS — M75121 Complete rotator cuff tear or rupture of right shoulder, not specified as traumatic: Secondary | ICD-10-CM | POA: Diagnosis not present

## 2018-11-26 DIAGNOSIS — G8918 Other acute postprocedural pain: Secondary | ICD-10-CM | POA: Diagnosis not present

## 2018-11-26 DIAGNOSIS — M24111 Other articular cartilage disorders, right shoulder: Secondary | ICD-10-CM | POA: Diagnosis not present

## 2018-11-26 DIAGNOSIS — X58XXXA Exposure to other specified factors, initial encounter: Secondary | ICD-10-CM | POA: Diagnosis not present

## 2018-11-26 DIAGNOSIS — Y999 Unspecified external cause status: Secondary | ICD-10-CM | POA: Diagnosis not present

## 2018-11-26 DIAGNOSIS — M7541 Impingement syndrome of right shoulder: Secondary | ICD-10-CM | POA: Diagnosis not present

## 2018-11-26 DIAGNOSIS — S46011A Strain of muscle(s) and tendon(s) of the rotator cuff of right shoulder, initial encounter: Secondary | ICD-10-CM | POA: Diagnosis not present

## 2018-12-06 DIAGNOSIS — S46011D Strain of muscle(s) and tendon(s) of the rotator cuff of right shoulder, subsequent encounter: Secondary | ICD-10-CM | POA: Diagnosis not present

## 2018-12-06 DIAGNOSIS — M25611 Stiffness of right shoulder, not elsewhere classified: Secondary | ICD-10-CM | POA: Diagnosis not present

## 2018-12-11 DIAGNOSIS — S46011D Strain of muscle(s) and tendon(s) of the rotator cuff of right shoulder, subsequent encounter: Secondary | ICD-10-CM | POA: Diagnosis not present

## 2018-12-11 DIAGNOSIS — M25611 Stiffness of right shoulder, not elsewhere classified: Secondary | ICD-10-CM | POA: Diagnosis not present

## 2018-12-13 DIAGNOSIS — M25611 Stiffness of right shoulder, not elsewhere classified: Secondary | ICD-10-CM | POA: Diagnosis not present

## 2018-12-13 DIAGNOSIS — S46011D Strain of muscle(s) and tendon(s) of the rotator cuff of right shoulder, subsequent encounter: Secondary | ICD-10-CM | POA: Diagnosis not present

## 2018-12-17 DIAGNOSIS — S46011D Strain of muscle(s) and tendon(s) of the rotator cuff of right shoulder, subsequent encounter: Secondary | ICD-10-CM | POA: Diagnosis not present

## 2018-12-17 DIAGNOSIS — M25611 Stiffness of right shoulder, not elsewhere classified: Secondary | ICD-10-CM | POA: Diagnosis not present

## 2019-01-02 DIAGNOSIS — S46011D Strain of muscle(s) and tendon(s) of the rotator cuff of right shoulder, subsequent encounter: Secondary | ICD-10-CM | POA: Diagnosis not present

## 2019-01-02 DIAGNOSIS — M25611 Stiffness of right shoulder, not elsewhere classified: Secondary | ICD-10-CM | POA: Diagnosis not present

## 2019-01-08 DIAGNOSIS — S46011D Strain of muscle(s) and tendon(s) of the rotator cuff of right shoulder, subsequent encounter: Secondary | ICD-10-CM | POA: Diagnosis not present

## 2019-01-08 DIAGNOSIS — M25611 Stiffness of right shoulder, not elsewhere classified: Secondary | ICD-10-CM | POA: Diagnosis not present

## 2019-01-15 DIAGNOSIS — M25611 Stiffness of right shoulder, not elsewhere classified: Secondary | ICD-10-CM | POA: Diagnosis not present

## 2019-01-15 DIAGNOSIS — S46011D Strain of muscle(s) and tendon(s) of the rotator cuff of right shoulder, subsequent encounter: Secondary | ICD-10-CM | POA: Diagnosis not present

## 2019-01-17 DIAGNOSIS — S46011D Strain of muscle(s) and tendon(s) of the rotator cuff of right shoulder, subsequent encounter: Secondary | ICD-10-CM | POA: Diagnosis not present

## 2019-01-17 DIAGNOSIS — M25611 Stiffness of right shoulder, not elsewhere classified: Secondary | ICD-10-CM | POA: Diagnosis not present

## 2019-01-20 DIAGNOSIS — S46011D Strain of muscle(s) and tendon(s) of the rotator cuff of right shoulder, subsequent encounter: Secondary | ICD-10-CM | POA: Diagnosis not present

## 2019-01-20 DIAGNOSIS — M25611 Stiffness of right shoulder, not elsewhere classified: Secondary | ICD-10-CM | POA: Diagnosis not present

## 2019-01-22 DIAGNOSIS — M25611 Stiffness of right shoulder, not elsewhere classified: Secondary | ICD-10-CM | POA: Diagnosis not present

## 2019-01-22 DIAGNOSIS — S46011D Strain of muscle(s) and tendon(s) of the rotator cuff of right shoulder, subsequent encounter: Secondary | ICD-10-CM | POA: Diagnosis not present

## 2019-01-27 DIAGNOSIS — M25611 Stiffness of right shoulder, not elsewhere classified: Secondary | ICD-10-CM | POA: Diagnosis not present

## 2019-01-27 DIAGNOSIS — S46011D Strain of muscle(s) and tendon(s) of the rotator cuff of right shoulder, subsequent encounter: Secondary | ICD-10-CM | POA: Diagnosis not present

## 2019-02-19 DIAGNOSIS — Z9889 Other specified postprocedural states: Secondary | ICD-10-CM | POA: Diagnosis not present

## 2019-05-20 DIAGNOSIS — N950 Postmenopausal bleeding: Secondary | ICD-10-CM | POA: Diagnosis not present

## 2019-05-20 DIAGNOSIS — N840 Polyp of corpus uteri: Secondary | ICD-10-CM | POA: Diagnosis not present

## 2019-05-26 DIAGNOSIS — Z09 Encounter for follow-up examination after completed treatment for conditions other than malignant neoplasm: Secondary | ICD-10-CM | POA: Diagnosis not present

## 2019-06-26 DIAGNOSIS — N840 Polyp of corpus uteri: Secondary | ICD-10-CM | POA: Diagnosis not present

## 2019-06-26 DIAGNOSIS — N950 Postmenopausal bleeding: Secondary | ICD-10-CM | POA: Diagnosis not present

## 2019-07-03 DIAGNOSIS — Z1231 Encounter for screening mammogram for malignant neoplasm of breast: Secondary | ICD-10-CM | POA: Diagnosis not present

## 2019-07-11 DIAGNOSIS — K219 Gastro-esophageal reflux disease without esophagitis: Secondary | ICD-10-CM | POA: Diagnosis not present

## 2019-07-11 DIAGNOSIS — E669 Obesity, unspecified: Secondary | ICD-10-CM | POA: Diagnosis not present

## 2019-07-11 DIAGNOSIS — R7303 Prediabetes: Secondary | ICD-10-CM | POA: Diagnosis not present

## 2019-07-11 DIAGNOSIS — Z13220 Encounter for screening for lipoid disorders: Secondary | ICD-10-CM | POA: Diagnosis not present

## 2019-07-11 DIAGNOSIS — Z79899 Other long term (current) drug therapy: Secondary | ICD-10-CM | POA: Diagnosis not present

## 2019-07-11 DIAGNOSIS — F3342 Major depressive disorder, recurrent, in full remission: Secondary | ICD-10-CM | POA: Diagnosis not present

## 2019-07-18 ENCOUNTER — Other Ambulatory Visit: Payer: Self-pay | Admitting: Family Medicine

## 2019-07-18 DIAGNOSIS — R7989 Other specified abnormal findings of blood chemistry: Secondary | ICD-10-CM

## 2019-08-01 ENCOUNTER — Ambulatory Visit
Admission: RE | Admit: 2019-08-01 | Discharge: 2019-08-01 | Disposition: A | Discharge status: Home / Self Care | Payer: BC Managed Care – PPO | Source: Ambulatory Visit | Attending: Family Medicine | Admitting: Family Medicine

## 2019-08-01 DIAGNOSIS — R7989 Other specified abnormal findings of blood chemistry: Secondary | ICD-10-CM

## 2020-02-02 DIAGNOSIS — K219 Gastro-esophageal reflux disease without esophagitis: Secondary | ICD-10-CM | POA: Diagnosis not present

## 2020-02-02 DIAGNOSIS — Z23 Encounter for immunization: Secondary | ICD-10-CM | POA: Diagnosis not present

## 2020-02-02 DIAGNOSIS — R748 Abnormal levels of other serum enzymes: Secondary | ICD-10-CM | POA: Diagnosis not present

## 2020-02-02 DIAGNOSIS — Z13220 Encounter for screening for lipoid disorders: Secondary | ICD-10-CM | POA: Diagnosis not present

## 2020-02-02 DIAGNOSIS — R7303 Prediabetes: Secondary | ICD-10-CM | POA: Diagnosis not present

## 2020-02-02 DIAGNOSIS — E669 Obesity, unspecified: Secondary | ICD-10-CM | POA: Diagnosis not present

## 2020-02-02 DIAGNOSIS — Z0000 Encounter for general adult medical examination without abnormal findings: Secondary | ICD-10-CM | POA: Diagnosis not present

## 2020-02-02 DIAGNOSIS — F3342 Major depressive disorder, recurrent, in full remission: Secondary | ICD-10-CM | POA: Diagnosis not present

## 2020-02-12 DIAGNOSIS — U071 COVID-19: Secondary | ICD-10-CM | POA: Diagnosis not present

## 2020-02-12 DIAGNOSIS — Z20828 Contact with and (suspected) exposure to other viral communicable diseases: Secondary | ICD-10-CM | POA: Diagnosis not present

## 2020-02-12 DIAGNOSIS — Z1152 Encounter for screening for COVID-19: Secondary | ICD-10-CM | POA: Diagnosis not present

## 2020-03-26 DIAGNOSIS — Z01419 Encounter for gynecological examination (general) (routine) without abnormal findings: Secondary | ICD-10-CM | POA: Diagnosis not present

## 2020-03-26 DIAGNOSIS — Z6832 Body mass index (BMI) 32.0-32.9, adult: Secondary | ICD-10-CM | POA: Diagnosis not present

## 2020-05-17 DIAGNOSIS — R0789 Other chest pain: Secondary | ICD-10-CM | POA: Diagnosis not present

## 2020-06-14 DIAGNOSIS — R079 Chest pain, unspecified: Secondary | ICD-10-CM | POA: Diagnosis not present

## 2020-07-05 DIAGNOSIS — Z1231 Encounter for screening mammogram for malignant neoplasm of breast: Secondary | ICD-10-CM | POA: Diagnosis not present

## 2020-08-20 ENCOUNTER — Ambulatory Visit (INDEPENDENT_AMBULATORY_CARE_PROVIDER_SITE_OTHER): Payer: BC Managed Care – PPO | Admitting: Internal Medicine

## 2020-08-20 ENCOUNTER — Encounter: Payer: Self-pay | Admitting: Internal Medicine

## 2020-08-20 ENCOUNTER — Other Ambulatory Visit: Payer: Self-pay

## 2020-08-20 VITALS — BP 124/76 | HR 68 | Ht 66.25 in | Wt 208.4 lb

## 2020-08-20 DIAGNOSIS — R079 Chest pain, unspecified: Secondary | ICD-10-CM

## 2020-08-20 DIAGNOSIS — Z1321 Encounter for screening for nutritional disorder: Secondary | ICD-10-CM

## 2020-08-20 NOTE — Patient Instructions (Signed)
Medication Instructions:  No changes today *If you need a refill on your cardiac medications before your next appointment, please call your pharmacy*   Lab Work: Today: NMR lipoprofile, vit D  If you have labs (blood work) drawn today and your tests are completely normal, you will receive your results only by: MyChart Message (if you have MyChart) OR A paper copy in the mail If you have any lab test that is abnormal or we need to change your treatment, we will call you to review the results.   Testing/Procedures: Calcium score ct scan   Follow-Up: As needed, pending testing results   Other Instructions

## 2020-08-20 NOTE — Progress Notes (Signed)
Cardiology Office Note   Date:  08/20/2020   ID:  Donna Herman, DOB 1960/06/09, MRN [id number]  PCP:  Sigmund Hazel, MD  Cardiologist:   Dietrich Pates, MD   Pt referred for CP       History of Present Illness: Donna Herman is a 60 y.o. female with a history of chest discomfort    She was seen in [place] in early may for CP    Then seen by L Hyacinth Meeker [facility])  The pt says her initial pain was earlier this spring   Had been in [place]   When got back develop   Left sided discomfort.   Tension sensation.   Not associated with activity   Would come and go with activity    No precipitating factor   No SOB  No diaphoresis.   WOuld last seconds to min    Went to [place] (Dr Hyacinth Meeker) CXR ordered by Dr Hyacinth Meeker   Normal  Since seen rarely have a spell      Now at beach more   Walks/exercises occsiaonally      DIET   Br:  [place] toast  banana nut muffin   Almond milk Lunch   Varies    Leftovoers   [employer] and one to 2 veggies Drink:   Water   Unsweet almond milk  Tea Hot in AM   Day Splenda   Occasion sugar       Current Meds  Medication Sig   buPROPion (WELLBUTRIN XL) 150 MG 24 hr tablet Take 150 mg by mouth daily.   Calcium Carbonate (CALCIUM 600 PO) Take by mouth.   cholecalciferol (VITAMIN D3) 25 MCG (1000 UNIT) tablet Take 1,000 Units by mouth daily.   estradiol (ESTRACE) 2 MG tablet Take 2 mg by mouth daily.   ferrous sulfate 325 (65 FE) MG EC tablet Take 325 mg by mouth 3 (three) times daily with meals.   fish oil-omega-3 fatty acids 1000 MG capsule Take 2 g by mouth daily.   Melatonin 10 MG CAPS Take 10 mg by mouth as needed (sleep).   Multiple Vitamin (MULTIVITAMIN) tablet Take 1 tablet by mouth daily.   omeprazole (PRILOSEC) 20 MG capsule Take 20 mg by mouth daily as needed (reflux).   PROGESTERONE PO Take 100 mg by mouth.   vitamin B-12 (CYANOCOBALAMIN) 1000 MCG tablet Take 1,000 mcg by mouth daily.     Allergies:   Patient has no known allergies.   Past  Medical History:  Diagnosis Date   Allergic rhinitis    Anemia    Depression    Elevated liver enzymes    Gastro-esophageal reflux disease without esophagitis    IBS (irritable bowel syndrome)    Medial knee pain, left    Neck pain    Obesity    Perioral dermatitis    Prediabetes    Recurrent major depressive disorder, in full remission (HCC)    Subacromial bursitis     Past Surgical History:  Procedure Laterality Date   SHOULDER SURGERY     WIDSOM TEETH       Social History:  The patient  reports that she has never smoked. She has never used smokeless tobacco. She reports current alcohol use. She reports that she does not use drugs.   Family History:  The patient's family history includes Alcoholism in her brother; Arthritis in her mother; Bladder Cancer in her brother; Diabetes in her brother and father; Fibromyalgia  in her sister; Gout in her brother; Heart attack in her father; Other in her mother and sister.    ROS:  Please see the history of present illness. All other systems are reviewed and  Negative to the above problem except as noted.    PHYSICAL EXAM: VS:  BP 124/76   Pulse 68   Ht 5' 6.25" (1.683 m)   Wt 208 lb 6.4 oz (94.5 kg)   LMP 10/05/2012   SpO2 97%   BMI 33.38 kg/m   FVC:BSWHQ 60 yo in no acute distress  HEENT: normal  Neck: no JVD, carotid bruits Cardiac: RRR; no murmurs  No LE edema  Respiratory:  clear to auscultation bilaterally,  GI: soft, nontender, nondistended, + BS  No hepatomegaly  MS: no deformity Moving all extremities   Skin: warm and dry, no rash Neuro:  Strength and sensation are intact Psych: euthymic mood, full affect   EKG:  EKG is not ordered today.  On 05/17/20  SB 51 bpm     Lipid Panel No results found for: CHOL, TRIG, HDL, CHOLHDL, VLDL, LDLCALC, LDLDIRECT    Wt Readings from Last 3 Encounters:  08/20/20 208 lb 6.4 oz (94.5 kg)  12/31/12 211 lb (95.7 kg)  10/15/12 211 lb (95.7 kg)      ASSESSMENT AND  PLAN:  1  CP   Pt had CP that began earlier thsi sping   Atypical   Has improved wihout intervention    IT does not sound cardiac in origin   Follow   2   Cardiac risk stratification   Pt with evid of hepatic steatosis and therefor at incrased risk for CAD    Will set up for labs (lipomed, lp(a), APoB), as well as Ca score CT      3   Nutrition  Recent USN of abdomen showed hepatic steatosis    Discussed diet    Watch sugar    Will refer to dietary    F/U based on test results     Current medicines are reviewed at length with the patient today.  The patient does not have concerns regarding medicines.  Signed, Dietrich Pates, MD  08/20/2020 10:22 AM    [facility] [address] [place], [place], [place]  [address] Phone: [phone]; Fax: [phone]

## 2020-08-21 LAB — NMR, LIPOPROFILE
Cholesterol, Total: 192 mg/dL (ref 100–199)
HDL Particle Number: 47.2 umol/L (ref >=30.5)
HDL-C: 62 mg/dL (ref >39)
LDL Particle Number: 1133 nmol/L — ABNORMAL HIGH (ref <1000)
LDL Size: 21.4 nm (ref >20.5)
LDL-C (NIH Calc): 89 mg/dL (ref 0–99)
LP-IR Score: 57 — ABNORMAL HIGH (ref <=45)
Small LDL Particle Number: 422 nmol/L (ref <=527)
Triglycerides: 248 mg/dL — ABNORMAL HIGH (ref 0–149)

## 2020-08-21 LAB — LIPOPROTEIN A (LPA): Lipoprotein (a): 27.7 nmol/L (ref <75.0)

## 2020-08-21 LAB — APOLIPOPROTEIN B: Apolipoprotein B: 77 mg/dL (ref <90)

## 2020-08-21 LAB — VITAMIN D 25 HYDROXY (VIT D DEFICIENCY, FRACTURES): Vit D, 25-Hydroxy: 38.2 ng/mL (ref 30.0–100.0)

## 2020-10-08 ENCOUNTER — Encounter: Payer: BC Managed Care – PPO | Attending: Internal Medicine | Admitting: Dietician

## 2020-10-08 ENCOUNTER — Other Ambulatory Visit: Payer: Self-pay

## 2020-10-08 ENCOUNTER — Encounter: Payer: Self-pay | Admitting: Dietician

## 2020-10-08 ENCOUNTER — Ambulatory Visit (INDEPENDENT_AMBULATORY_CARE_PROVIDER_SITE_OTHER)
Admission: RE | Admit: 2020-10-08 | Discharge: 2020-10-08 | Disposition: A | Discharge status: Home / Self Care | Payer: Self-pay | Source: Ambulatory Visit | Attending: Internal Medicine | Admitting: Internal Medicine

## 2020-10-08 DIAGNOSIS — I871 Compression of vein: Secondary | ICD-10-CM | POA: Diagnosis not present

## 2020-10-08 DIAGNOSIS — R079 Chest pain, unspecified: Secondary | ICD-10-CM

## 2020-10-08 NOTE — Patient Instructions (Addendum)
Continue a low fat high vegetable diet. Whole grains (quinoa, barley, brown rice, etc) Legumes Fresh fruit Aim for more than >25 grams fiber per day  Continue to stay active Continue mindful eating and weight loss  Great job on the changes that you have made!

## 2020-10-08 NOTE — Progress Notes (Signed)
Medical Nutrition Therapy  Appointment Start time:  1310  Appointment End time:  1420  Primary concerns today: Patient is being worked up for chest pain.  She had a chest CT this am and results are pending.  She also was just found to have fatty liver and increased triglycerides.  She would like to learn better options to improve this.  She has been eating more fruits and vegetables.  She has reduced her carbohydrate intake.  Avoids sugar.  She has lost 17 lbs in the past 8 weeks.  She states that she cannot lose weight unless she exercises.    She states that the chest pain has reduced since before seeing MD 08/20/2020.   Referral diagnosis: chest pain, nutrition disorder Preferred learning style: no preference indicated Learning readiness: ready, change in progress   NUTRITION ASSESSMENT   Anthropometrics  66.5" 191 lbs 10/08/2020 208 lbs 08/20/2020   Clinical Medical Hx: hepatic stenosis, increased triglycerides Medications: see list Labs: 08/20/2020:  HDL 62, LDL 89, LDL particle # 1133, Triglycerides 248, Vitamin D 38 Notable Signs/Symptoms: none  Lifestyle & Dietary Hx Patient lives with her husband.  They [place] most of the time [place].  She has a garden and is outside daily.  She reports a poor memory.  Supplements: vitamin D, MVI, Omega 3, vitamin B-12, vitamin C.  She was told to watch her calcium intake until the results of her CT scan are known. Sleep: very well.  8 hours per night Stress / self-care: normal Current average weekly physical activity: 5 days per week since August 5.  Walks on the beach or elliptical, recumbent bike and rowing machine in her home at the beach.  Minimum 35 minutes and most 45-55 minutes daily.  24-Hr Dietary Recall Avoids bananas ("high in sugar") Only uses olive oil, no fried foods Stopped alcohol (was drinking 3 glasses of wine twice per month). Limits red meat.  Eats it only about 10% of the time. First Meal: protein shake (low  sugar protein powder - Invigor8), fruit, vegetables, apple cider vinegar, water) OR eggs 1-2 days per week, salsa, 1/2 avocado OR yogurt with nuts, dried figs Snack: none Second Meal:  Salad with oil and vinegar OR leftovers OR [employer] ([place] chicken meal with quinoa, rice) Snack:  Third Meal: meat and vegetables OR Okra, tomatoes, shrimp OR fish (fresh caught) grilled and vegetables Snack: rare goat cheese with almond cracker and fig jam Beverages: water, protein shake, unsweetened tea with splenda  NUTRITION DIAGNOSIS  NB-1.1 Food and nutrition-related knowledge deficit As related to nutrition for liver.  As evidenced by diet hx and patient report.   NUTRITION INTERVENTION  Nutrition education (E-1) on the following topics:  Encouragement of changes made Benefits of increased fiber and minimally processed foods Benefits of continued activity Benefits of continued weight loss Legumes as plant based protein source Continue avoiding sugar  Handouts Provided Include  High triglyceride nutrition therapy High triglyceride label reading tips High triglyceride meal planning tips Mediterranean diet  Learning Style & Readiness for Change Teaching method utilized: Visual & Auditory  Demonstrated degree of understanding via: Teach Back  Barriers to learning/adherence to lifestyle change: none  Goals Established by Pt Continue a low fat high vegetable diet. Whole grains (quinoa, barley, brown rice, etc) Legumes Fresh fruit Aim for more than >25 grams fiber per day  Continue to stay active Continue mindful eating and weight loss  Great job on the changes that you have made!  MONITORING & EVALUATION Dietary intake, weekly physical activity, and label reading prn.  Next Steps  Patient is to call for questions.

## 2021-02-01 DIAGNOSIS — E119 Type 2 diabetes mellitus without complications: Secondary | ICD-10-CM | POA: Diagnosis not present

## 2021-02-02 DIAGNOSIS — Z13220 Encounter for screening for lipoid disorders: Secondary | ICD-10-CM | POA: Diagnosis not present

## 2021-02-02 DIAGNOSIS — F3342 Major depressive disorder, recurrent, in full remission: Secondary | ICD-10-CM | POA: Diagnosis not present

## 2021-02-02 DIAGNOSIS — E663 Overweight: Secondary | ICD-10-CM | POA: Diagnosis not present

## 2021-02-02 DIAGNOSIS — Z0000 Encounter for general adult medical examination without abnormal findings: Secondary | ICD-10-CM | POA: Diagnosis not present

## 2021-02-02 DIAGNOSIS — K760 Fatty (change of) liver, not elsewhere classified: Secondary | ICD-10-CM | POA: Diagnosis not present

## 2021-02-02 DIAGNOSIS — R7303 Prediabetes: Secondary | ICD-10-CM | POA: Diagnosis not present

## 2021-02-02 DIAGNOSIS — K625 Hemorrhage of anus and rectum: Secondary | ICD-10-CM | POA: Diagnosis not present

## 2021-02-02 DIAGNOSIS — K219 Gastro-esophageal reflux disease without esophagitis: Secondary | ICD-10-CM | POA: Diagnosis not present

## 2021-03-28 DIAGNOSIS — Z01419 Encounter for gynecological examination (general) (routine) without abnormal findings: Secondary | ICD-10-CM | POA: Diagnosis not present

## 2021-03-28 DIAGNOSIS — Z6828 Body mass index (BMI) 28.0-28.9, adult: Secondary | ICD-10-CM | POA: Diagnosis not present

## 2021-03-28 DIAGNOSIS — R87610 Atypical squamous cells of undetermined significance on cytologic smear of cervix (ASC-US): Secondary | ICD-10-CM | POA: Diagnosis not present

## 2021-05-17 DIAGNOSIS — K760 Fatty (change of) liver, not elsewhere classified: Secondary | ICD-10-CM | POA: Diagnosis not present

## 2021-05-17 DIAGNOSIS — K219 Gastro-esophageal reflux disease without esophagitis: Secondary | ICD-10-CM | POA: Diagnosis not present

## 2021-05-17 DIAGNOSIS — K625 Hemorrhage of anus and rectum: Secondary | ICD-10-CM | POA: Diagnosis not present

## 2021-05-17 DIAGNOSIS — K6289 Other specified diseases of anus and rectum: Secondary | ICD-10-CM | POA: Diagnosis not present

## 2021-06-23 DIAGNOSIS — K319 Disease of stomach and duodenum, unspecified: Secondary | ICD-10-CM | POA: Diagnosis not present

## 2021-06-23 DIAGNOSIS — K2970 Gastritis, unspecified, without bleeding: Secondary | ICD-10-CM | POA: Diagnosis not present

## 2021-06-23 DIAGNOSIS — K625 Hemorrhage of anus and rectum: Secondary | ICD-10-CM | POA: Diagnosis not present

## 2021-06-23 DIAGNOSIS — K648 Other hemorrhoids: Secondary | ICD-10-CM | POA: Diagnosis not present

## 2021-06-23 DIAGNOSIS — K317 Polyp of stomach and duodenum: Secondary | ICD-10-CM | POA: Diagnosis not present

## 2021-06-23 DIAGNOSIS — K5730 Diverticulosis of large intestine without perforation or abscess without bleeding: Secondary | ICD-10-CM | POA: Diagnosis not present

## 2021-06-23 DIAGNOSIS — R12 Heartburn: Secondary | ICD-10-CM | POA: Diagnosis not present

## 2021-06-23 DIAGNOSIS — K2100 Gastro-esophageal reflux disease with esophagitis, without bleeding: Secondary | ICD-10-CM | POA: Diagnosis not present

## 2021-07-14 DIAGNOSIS — Z1231 Encounter for screening mammogram for malignant neoplasm of breast: Secondary | ICD-10-CM | POA: Diagnosis not present

## 2022-01-17 DIAGNOSIS — R059 Cough, unspecified: Secondary | ICD-10-CM | POA: Diagnosis not present

## 2022-01-17 DIAGNOSIS — U071 COVID-19: Secondary | ICD-10-CM | POA: Diagnosis not present

## 2022-02-17 DIAGNOSIS — Z136 Encounter for screening for cardiovascular disorders: Secondary | ICD-10-CM | POA: Diagnosis not present

## 2022-02-17 DIAGNOSIS — R059 Cough, unspecified: Secondary | ICD-10-CM | POA: Diagnosis not present

## 2022-02-17 DIAGNOSIS — Z1329 Encounter for screening for other suspected endocrine disorder: Secondary | ICD-10-CM | POA: Diagnosis not present

## 2022-02-17 DIAGNOSIS — R030 Elevated blood-pressure reading, without diagnosis of hypertension: Secondary | ICD-10-CM | POA: Diagnosis not present

## 2022-02-17 DIAGNOSIS — Z6836 Body mass index (BMI) 36.0-36.9, adult: Secondary | ICD-10-CM | POA: Diagnosis not present

## 2022-03-03 DIAGNOSIS — R053 Chronic cough: Secondary | ICD-10-CM | POA: Diagnosis not present

## 2022-03-03 DIAGNOSIS — Z6836 Body mass index (BMI) 36.0-36.9, adult: Secondary | ICD-10-CM | POA: Diagnosis not present

## 2022-03-03 DIAGNOSIS — K219 Gastro-esophageal reflux disease without esophagitis: Secondary | ICD-10-CM | POA: Diagnosis not present

## 2022-03-03 DIAGNOSIS — R030 Elevated blood-pressure reading, without diagnosis of hypertension: Secondary | ICD-10-CM | POA: Diagnosis not present

## 2022-03-06 DIAGNOSIS — N950 Postmenopausal bleeding: Secondary | ICD-10-CM | POA: Diagnosis not present

## 2022-04-20 DIAGNOSIS — N950 Postmenopausal bleeding: Secondary | ICD-10-CM | POA: Diagnosis not present

## 2022-04-25 DIAGNOSIS — Z124 Encounter for screening for malignant neoplasm of cervix: Secondary | ICD-10-CM | POA: Diagnosis not present

## 2022-04-25 DIAGNOSIS — Z01419 Encounter for gynecological examination (general) (routine) without abnormal findings: Secondary | ICD-10-CM | POA: Diagnosis not present

## 2022-04-25 DIAGNOSIS — Z6838 Body mass index (BMI) 38.0-38.9, adult: Secondary | ICD-10-CM | POA: Diagnosis not present

## 2022-06-02 DIAGNOSIS — R053 Chronic cough: Secondary | ICD-10-CM | POA: Diagnosis not present

## 2022-06-02 DIAGNOSIS — K219 Gastro-esophageal reflux disease without esophagitis: Secondary | ICD-10-CM | POA: Diagnosis not present

## 2022-06-02 DIAGNOSIS — J300 Vasomotor rhinitis: Secondary | ICD-10-CM | POA: Diagnosis not present

## 2022-06-15 DIAGNOSIS — L814 Other melanin hyperpigmentation: Secondary | ICD-10-CM | POA: Diagnosis not present

## 2022-06-15 DIAGNOSIS — R58 Hemorrhage, not elsewhere classified: Secondary | ICD-10-CM | POA: Diagnosis not present

## 2022-06-15 DIAGNOSIS — L918 Other hypertrophic disorders of the skin: Secondary | ICD-10-CM | POA: Diagnosis not present

## 2022-06-15 DIAGNOSIS — R208 Other disturbances of skin sensation: Secondary | ICD-10-CM | POA: Diagnosis not present

## 2022-06-15 DIAGNOSIS — L720 Epidermal cyst: Secondary | ICD-10-CM | POA: Diagnosis not present

## 2022-06-15 DIAGNOSIS — L538 Other specified erythematous conditions: Secondary | ICD-10-CM | POA: Diagnosis not present

## 2022-06-15 DIAGNOSIS — D224 Melanocytic nevi of scalp and neck: Secondary | ICD-10-CM | POA: Diagnosis not present

## 2022-06-15 DIAGNOSIS — L821 Other seborrheic keratosis: Secondary | ICD-10-CM | POA: Diagnosis not present

## 2022-06-23 DIAGNOSIS — R053 Chronic cough: Secondary | ICD-10-CM | POA: Diagnosis not present

## 2022-06-23 DIAGNOSIS — J300 Vasomotor rhinitis: Secondary | ICD-10-CM | POA: Diagnosis not present

## 2022-06-23 DIAGNOSIS — K219 Gastro-esophageal reflux disease without esophagitis: Secondary | ICD-10-CM | POA: Diagnosis not present

## 2022-09-21 DIAGNOSIS — B09 Unspecified viral infection characterized by skin and mucous membrane lesions: Secondary | ICD-10-CM | POA: Diagnosis not present

## 2022-09-26 DIAGNOSIS — R059 Cough, unspecified: Secondary | ICD-10-CM | POA: Diagnosis not present

## 2022-09-26 DIAGNOSIS — H7292 Unspecified perforation of tympanic membrane, left ear: Secondary | ICD-10-CM | POA: Diagnosis not present

## 2022-09-26 DIAGNOSIS — K219 Gastro-esophageal reflux disease without esophagitis: Secondary | ICD-10-CM | POA: Diagnosis not present

## 2022-10-16 DIAGNOSIS — Z1231 Encounter for screening mammogram for malignant neoplasm of breast: Secondary | ICD-10-CM | POA: Diagnosis not present

## 2022-10-18 DIAGNOSIS — M21621 Bunionette of right foot: Secondary | ICD-10-CM | POA: Diagnosis not present

## 2022-10-18 DIAGNOSIS — M24571 Contracture, right ankle: Secondary | ICD-10-CM | POA: Diagnosis not present

## 2022-10-18 DIAGNOSIS — M7741 Metatarsalgia, right foot: Secondary | ICD-10-CM | POA: Diagnosis not present

## 2022-10-18 DIAGNOSIS — M89371 Hypertrophy of bone, right ankle and foot: Secondary | ICD-10-CM | POA: Diagnosis not present

## 2022-11-01 DIAGNOSIS — M89371 Hypertrophy of bone, right ankle and foot: Secondary | ICD-10-CM | POA: Diagnosis not present

## 2022-11-01 DIAGNOSIS — M7751 Other enthesopathy of right foot: Secondary | ICD-10-CM | POA: Diagnosis not present

## 2022-11-01 DIAGNOSIS — M7741 Metatarsalgia, right foot: Secondary | ICD-10-CM | POA: Diagnosis not present

## 2022-11-01 DIAGNOSIS — M24571 Contracture, right ankle: Secondary | ICD-10-CM | POA: Diagnosis not present

## 2022-11-01 DIAGNOSIS — M21621 Bunionette of right foot: Secondary | ICD-10-CM | POA: Diagnosis not present

## 2022-11-21 DIAGNOSIS — N950 Postmenopausal bleeding: Secondary | ICD-10-CM | POA: Diagnosis not present

## 2022-12-05 DIAGNOSIS — F3342 Major depressive disorder, recurrent, in full remission: Secondary | ICD-10-CM | POA: Diagnosis not present

## 2022-12-05 DIAGNOSIS — Z713 Dietary counseling and surveillance: Secondary | ICD-10-CM | POA: Diagnosis not present

## 2022-12-05 DIAGNOSIS — Z6838 Body mass index (BMI) 38.0-38.9, adult: Secondary | ICD-10-CM | POA: Diagnosis not present

## 2022-12-05 DIAGNOSIS — K219 Gastro-esophageal reflux disease without esophagitis: Secondary | ICD-10-CM | POA: Diagnosis not present

## 2022-12-05 DIAGNOSIS — E669 Obesity, unspecified: Secondary | ICD-10-CM | POA: Diagnosis not present

## 2022-12-13 DIAGNOSIS — M216X2 Other acquired deformities of left foot: Secondary | ICD-10-CM | POA: Diagnosis not present

## 2022-12-13 DIAGNOSIS — M25572 Pain in left ankle and joints of left foot: Secondary | ICD-10-CM | POA: Diagnosis not present

## 2022-12-13 DIAGNOSIS — M79672 Pain in left foot: Secondary | ICD-10-CM | POA: Diagnosis not present

## 2022-12-13 DIAGNOSIS — M7752 Other enthesopathy of left foot: Secondary | ICD-10-CM | POA: Diagnosis not present

## 2023-03-10 IMAGING — CT CT CARDIAC CORONARY ARTERY CALCIUM SCORE
3 series · 14 of 20 positions shown, 16 images · non-contrast
Comparison: 06/14/2020 chest radiograph.
COMPARISON: 06/14/2020 chest radiograph.

Addendum:
EXAM:
OVER-READ INTERPRETATION  CT CHEST

The following report is an over-read performed by radiologist Dr.
Henry Giovanni Boada [REDACTED] on 10/08/2020. This over-read
does not include interpretation of cardiac or coronary anatomy or
pathology. The calcium score interpretation by the cardiologist is
attached.
CLINICAL DATA: Cardiovascular disease risk stratification
CT Coronary Calcium Score
TECHNIQUE: A gated, non-contrast computed tomography scan of the heart was
performed using 3mm slice thickness. Axial images were analyzed on a
dedicated workstation. Calcium scoring of the coronary arteries was
performed using the Agatston method.

[Series 2: cascseq 2.0 sa36 70% (id) · axial · 0.38mm/px · z∈[-213,-133]mm · 4 of 68 slices shown]
[im 14/68  vessel]
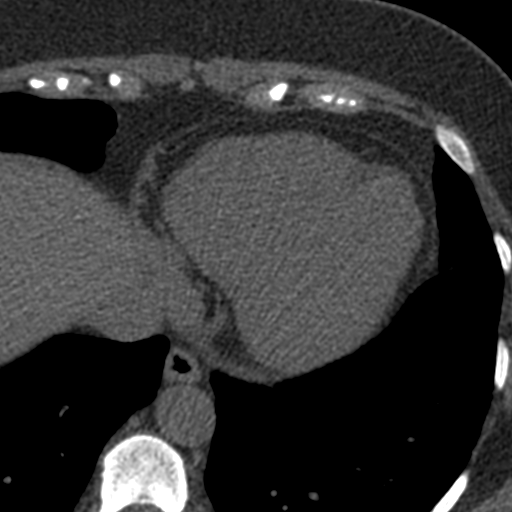
[im 27/68  vessel]
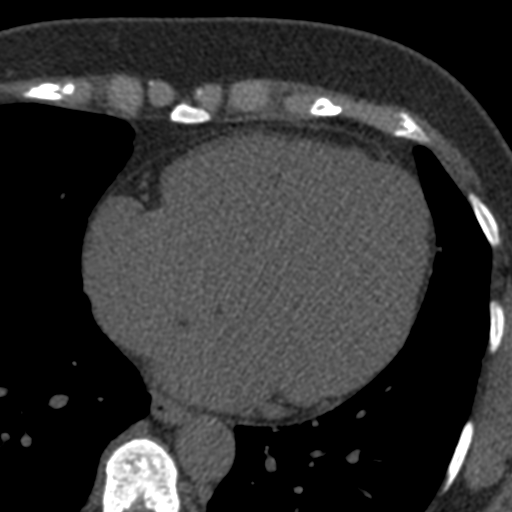
[im 41/68  vessel]
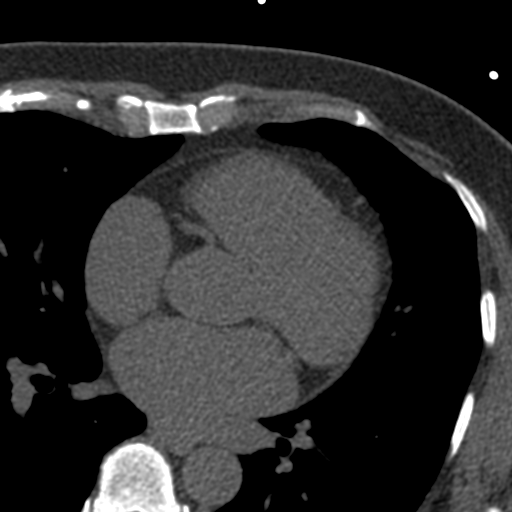
[im 54/68  vessel]
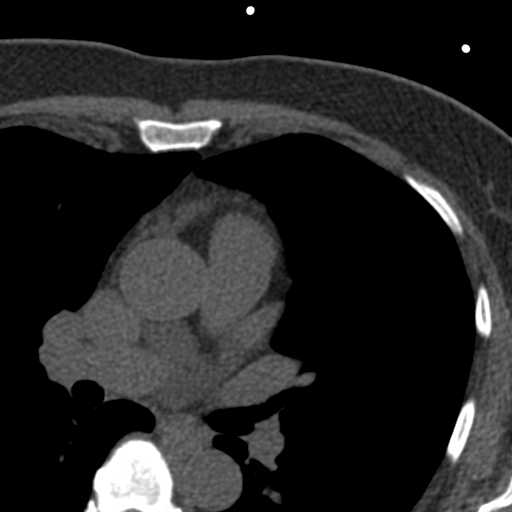

[Series 3: cascseq 2.0 bf37 st · axial · 0.71mm/px · z∈[-217,-129]mm · 5 of 68 slices shown, 7 images]
[im 12/68  vessel]
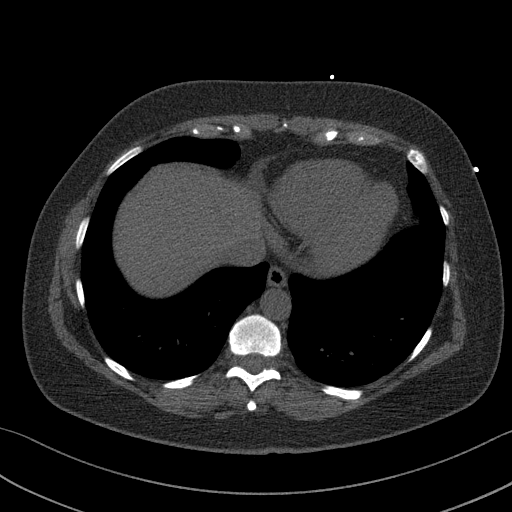
[im 12/68  lung]
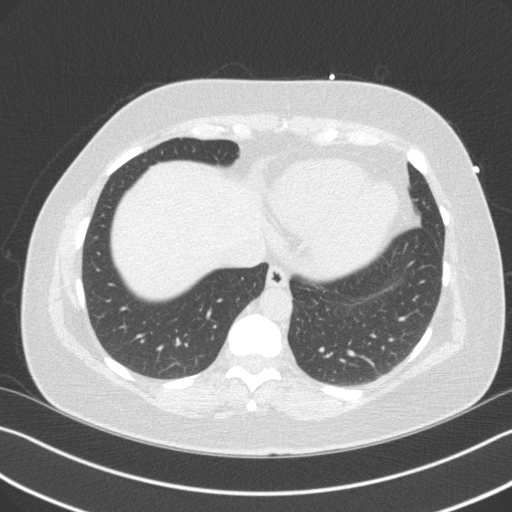
[im 23/68  vessel]
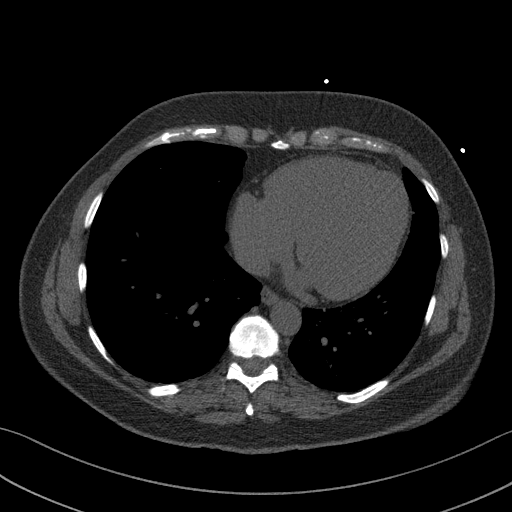
[im 34/68  vessel]
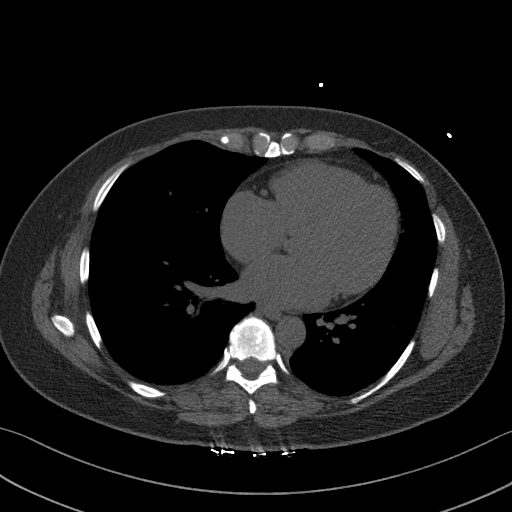
[im 45/68  vessel]
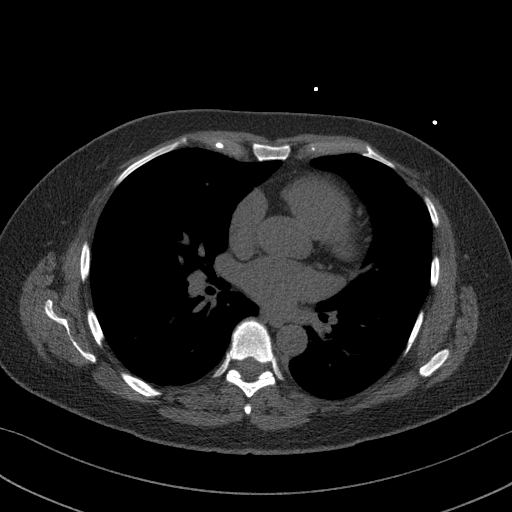
[im 56/68  vessel]
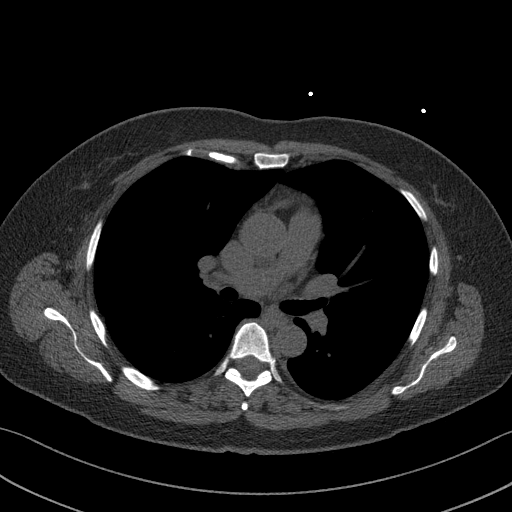
[im 56/68  lung]
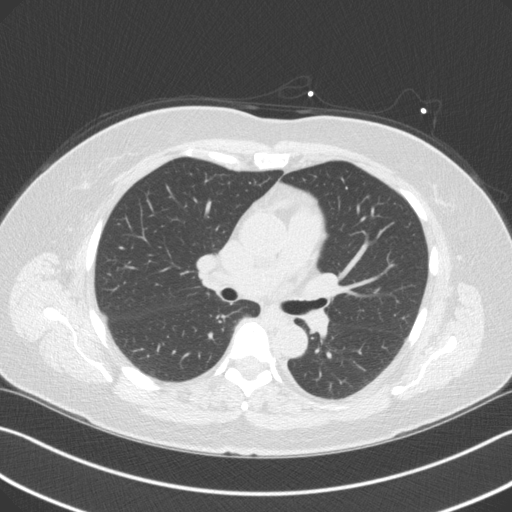

[Series 4: cascseq 2.0 br59 lung · axial · 0.71mm/px · z∈[-217,-129]mm · 5 of 68 slices shown]
[im 12/68  lung]
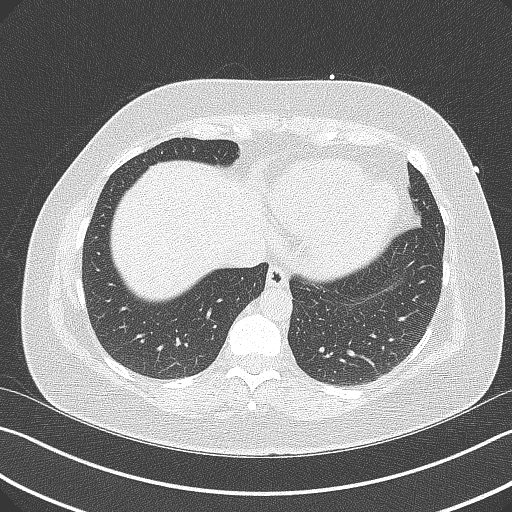
[im 23/68  lung]
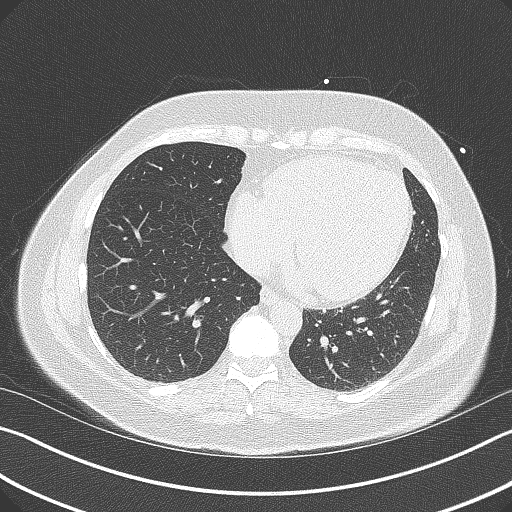
[im 34/68  lung]
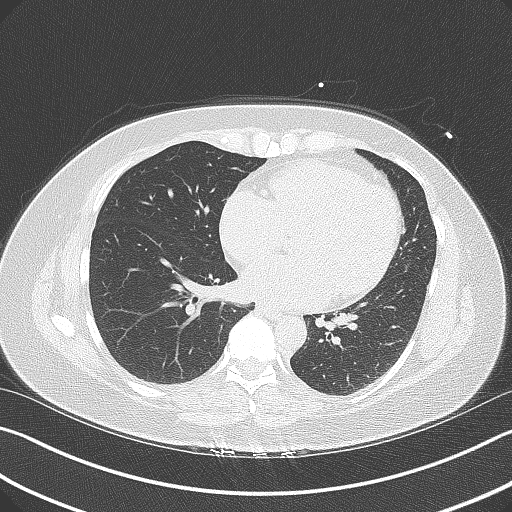
[im 45/68  lung]
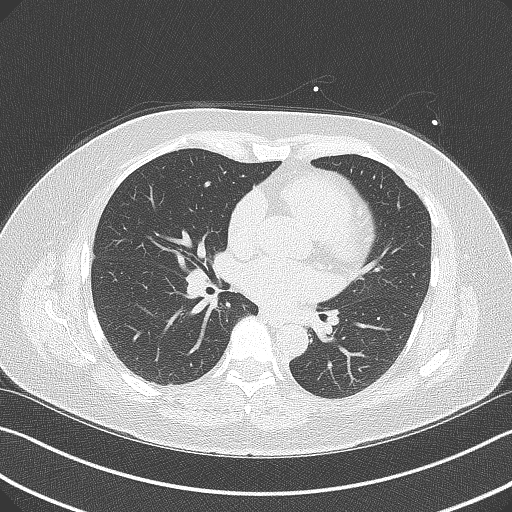
[im 56/68  lung]
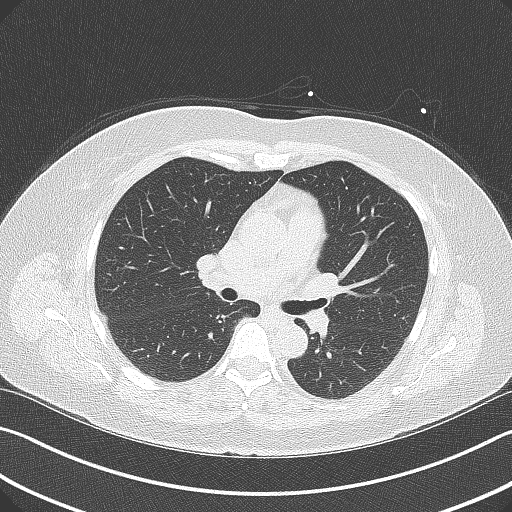

[14 of 20 positions shown; findings below may reference images not displayed]

FINDINGS: Vascular: Normal aortic caliber.

Mediastinum/Nodes: No imaged thoracic adenopathy.

Lungs/Pleura: No pleural fluid.  Clear imaged lungs.

Upper Abdomen: Normal imaged portions of the liver, spleen, stomach.

Musculoskeletal: No acute osseous abnormality.
IMPRESSION: No acute findings in the imaged extracardiac chest.
FINDINGS: Coronary arteries: Normal origins.

Coronary Calcium Score:

Left main: 0

Left anterior descending artery: 0

Left circumflex artery: 0

Right coronary artery: 0

Total: 0

Pericardium: Normal.

Ascending Aorta: Normal caliber. Ascending aorta measures
approximately 32mm at the mid ascending aorta measured in an axial
plane.

Non-cardiac: See separate report from [REDACTED].
IMPRESSION: Coronary calcium score of 0.



If CAC=0, it is reasonable to withhold statin therapy and reassess
in 5 to 10 years, as long as higher risk conditions are absent
(diabetes mellitus, family history of premature CHD in first degree
relatives (males <55 years; females <65 years), cigarette smoking,
or LDL >=190 mg/dL).

If CAC is 1 to 99, it is reasonable to initiate statin therapy for
patients >=55 years of age.

If CAC is >=100 or >=75th percentile, it is reasonable to initiate
statin therapy at any age.

Cardiology referral should be considered for patients with CAC
scores >=400 or >=75th percentile.

*3063 AHA/ACC/AACVPR/AAPA/ABC/RAMUNIU/FUCA/ARCHEN/Ouhibi/DEEP/MAEKO/NAZARETH
Guideline on the Management of Blood Cholesterol: A Report of the
American College of Cardiology/American Heart Association Task Force
on Clinical Practice Guidelines. J Am Coll Cardiol.
0472;73(24):3299-3490.

*** End of Addendum ***
EXAM:
OVER-READ INTERPRETATION  CT CHEST

The following report is an over-read performed by radiologist Dr.
Henry Giovanni Boada [REDACTED] on 10/08/2020. This over-read
does not include interpretation of cardiac or coronary anatomy or
pathology. The calcium score interpretation by the cardiologist is
attached.
FINDINGS: Vascular: Normal aortic caliber.

Mediastinum/Nodes: No imaged thoracic adenopathy.

Lungs/Pleura: No pleural fluid.  Clear imaged lungs.

Upper Abdomen: Normal imaged portions of the liver, spleen, stomach.

Musculoskeletal: No acute osseous abnormality.
IMPRESSION: No acute findings in the imaged extracardiac chest.

## 2023-10-30 ENCOUNTER — Other Ambulatory Visit: Payer: Self-pay | Admitting: Obstetrics and Gynecology

## 2023-10-30 DIAGNOSIS — R928 Other abnormal and inconclusive findings on diagnostic imaging of breast: Secondary | ICD-10-CM

## 2023-11-07 ENCOUNTER — Ambulatory Visit
Admission: RE | Admit: 2023-11-07 | Discharge: 2023-11-07 | Disposition: A | Discharge status: Home / Self Care | Source: Ambulatory Visit | Attending: Obstetrics and Gynecology | Admitting: Obstetrics and Gynecology

## 2023-11-07 DIAGNOSIS — R928 Other abnormal and inconclusive findings on diagnostic imaging of breast: Secondary | ICD-10-CM
